# Patient Record
Sex: Male | Born: 1985 | Race: White | Hispanic: No | Marital: Single | Smoking: Never smoker
Health system: Southern US, Community
[De-identification: ages and names within clinical notes are randomized; demographics above are authoritative.]

## PROBLEM LIST (undated history)

## (undated) HISTORY — PX: OTHER SURGICAL HISTORY: SHX169

---
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
## 2010-12-29 ENCOUNTER — Emergency Department (HOSPITAL_COMMUNITY): Payer: Self-pay

## 2010-12-29 ENCOUNTER — Emergency Department (HOSPITAL_COMMUNITY)
Admission: EM | Admit: 2010-12-29 | Discharge: 2010-12-29 | Disposition: A | Discharge status: Home / Self Care | Payer: Self-pay | Attending: Emergency Medicine | Admitting: Emergency Medicine

## 2010-12-29 DIAGNOSIS — R1013 Epigastric pain: Secondary | ICD-10-CM | POA: Insufficient documentation

## 2010-12-29 DIAGNOSIS — R079 Chest pain, unspecified: Secondary | ICD-10-CM | POA: Insufficient documentation

## 2012-10-27 ENCOUNTER — Emergency Department (HOSPITAL_COMMUNITY)
Admission: EM | Admit: 2012-10-27 | Discharge: 2012-10-27 | Disposition: A | Discharge status: Home / Self Care | Payer: Self-pay | Attending: Emergency Medicine | Admitting: Emergency Medicine

## 2012-10-27 ENCOUNTER — Encounter (HOSPITAL_COMMUNITY): Payer: Self-pay | Admitting: Emergency Medicine

## 2012-10-27 ENCOUNTER — Emergency Department (HOSPITAL_COMMUNITY): Payer: Self-pay

## 2012-10-27 DIAGNOSIS — R61 Generalized hyperhidrosis: Secondary | ICD-10-CM | POA: Insufficient documentation

## 2012-10-27 DIAGNOSIS — R109 Unspecified abdominal pain: Secondary | ICD-10-CM | POA: Insufficient documentation

## 2012-10-27 DIAGNOSIS — R3915 Urgency of urination: Secondary | ICD-10-CM | POA: Insufficient documentation

## 2012-10-27 LAB — URINALYSIS, ROUTINE W REFLEX MICROSCOPIC
Bilirubin Urine: NEGATIVE
Glucose, UA: NEGATIVE mg/dL
Specific Gravity, Urine: 1.017 (ref 1.005–1.030)
Urobilinogen, UA: 1 mg/dL (ref 0.0–1.0)

## 2012-10-27 LAB — URINE MICROSCOPIC-ADD ON

## 2012-10-27 NOTE — ED Provider Notes (Signed)
History    CSN: [id number] Arrival date & time 10/27/12  8295  First MD Initiated Contact with Patient 10/27/12 [phone]     Chief Complaint  Patient presents with  . Flank Pain   (Consider location/radiation/quality/duration/timing/severity/associated sxs/prior Treatment) HPI Comments: 27 year old male with no significant past medical history presents to the emergency department complaining of sudden onset left-sided flank pain beginning this morning about 2 hours prior to arrival. Patient states he woke up around 7:30 this morning, half an hour later he went to use the restroom any difficulty urinating and developed severe left-sided flank pain, 8/10. Pain lasted about 2 hours and subsided about 5 minutes prior to arriving to the emergency department without any specific intervention. States the pain left him "writing on the floor, flushed and sweaty". Admits to urinary urgency. Denies nausea, vomiting, hematuria, dysuria. No history of kidney stones. Denies fever or chills.  Patient is a 27 y.o. male presenting with flank pain. The history is provided by the patient.  Flank Pain Associated symptoms include diaphoresis. Pertinent negatives include no abdominal pain, chills, fever, nausea or vomiting.   No past medical history on file. Past Surgical History  Procedure Laterality Date  . Left arm surgery     No family history on file. History  Substance Use Topics  . Smoking status: Not on file  . Smokeless tobacco: Not on file  . Alcohol Use: Not on file    Review of Systems  Constitutional: Positive for diaphoresis. Negative for fever and chills.  Gastrointestinal: Negative for nausea, vomiting and abdominal pain.  Genitourinary: Positive for urgency, flank pain and decreased urine volume. Negative for dysuria, hematuria, penile swelling, scrotal swelling, penile pain and testicular pain.  All other systems reviewed and are negative.    Allergies  Review of patient's allergies  indicates no known allergies.  Home Medications  No current outpatient prescriptions on file. BP 114/77  Pulse 68  Temp(Src) 98.1 F (36.7 C) (Oral)  Resp 16  SpO2 96% Physical Exam  Nursing note and vitals reviewed. Constitutional: He is oriented to person, place, and time. He appears well-developed and well-nourished. No distress.  HENT:  Head: Normocephalic and atraumatic.  Mouth/Throat: Oropharynx is clear and moist.  Eyes: Conjunctivae are normal.  Neck: Normal range of motion. Neck supple.  Cardiovascular: Normal rate, regular rhythm and normal heart sounds.   Pulmonary/Chest: Effort normal and breath sounds normal. No respiratory distress.  Abdominal: Soft. Normal appearance and bowel sounds are normal. He exhibits no distension and no mass. There is no tenderness. There is CVA tenderness (left). There is no rigidity, no rebound and no guarding.  Musculoskeletal: Normal range of motion. He exhibits no edema.  Neurological: He is alert and oriented to person, place, and time.  Skin: Skin is warm and dry. He is not diaphoretic.  Psychiatric: He has a normal mood and affect. His behavior is normal.    ED Course  Procedures (including critical care time) Labs Reviewed  URINALYSIS, ROUTINE W REFLEX MICROSCOPIC - Abnormal; Notable for the following:    Hgb urine dipstick LARGE (*)    All other components within normal limits  URINE MICROSCOPIC-ADD ON   Ct Abdomen Pelvis Wo Contrast  10/27/2012   *RADIOLOGY REPORT*  Clinical Data: Left flank pain for 1 day  CT ABDOMEN AND PELVIS WITHOUT CONTRAST  Technique:  Multidetector CT imaging of the abdomen and pelvis was performed following the standard protocol without intravenous contrast.  Comparison: None.  Findings: The lung  bases are clear.  The liver is unremarkable in the unenhanced state.  No calcified gallstones are seen.  The pancreas is normal in size and the pancreatic duct is not dilated. The adrenal glands and spleen are  unremarkable.  The stomach is not well distended.  No renal calculi are seen, and there is no evidence of hydronephrosis.  The proximal ureters are normal in caliber.  The abdominal aorta is normal in caliber.  No adenopathy is seen.  The distal ureters also are normal in caliber and no distal ureteral calculus is noted.  The urinary bladder is not well distended but no abnormality is seen.  The prostate is normal in size.  No abnormality of the colon is seen.  The terminal ileum is unremarkable, and the appendix fills with air normally.  No inflammatory process is seen.  No hernia is evident.  The lumbar vertebrae are in normal alignment with normal intervertebral disc spaces.  IMPRESSION:  1.  No explanation for the patient's left flank pain is seen.  No renal or ureteral calculi are noted. 2.  The appendix and terminal ileum are unremarkable.   Original Report Authenticated By: Dwyane Dee, M.D.   1. Flank pain     MDM  Patient with left flank pain, urgency. Probable kidney stone. Will obtain UA, CT abd/pelvis. Currently asymptomatic. 11:53 AM CT scan without evidence of stone. Asymptomatic in ED. U/A with large hgb. Probable passed stone. He is in NAD. Stable for discharge. Return precautions discussed. Patient states understanding of plan and is agreeable.   Trevor Mace, PA-C 10/27/12 1154

## 2012-10-27 NOTE — Progress Notes (Signed)
[facility] provided pt with a [facility] app and a list of primary care resources.  °

## 2012-10-27 NOTE — ED Provider Notes (Signed)
Medical screening examination/treatment/procedure(s) were performed by non-physician practitioner and as supervising physician I was immediately available for consultation/collaboration.  Ethelda Chick, MD 10/27/12 [phone]

## 2012-10-27 NOTE — ED Notes (Signed)
Per EMS patient reports left flank pain this am that left him writhing on floor, flushed, and sweating, subsided some approximately five minutes ago.

## 2014-04-25 ENCOUNTER — Encounter (HOSPITAL_COMMUNITY): Payer: Self-pay | Admitting: Emergency Medicine

## 2014-04-25 ENCOUNTER — Emergency Department (HOSPITAL_COMMUNITY)
Admission: EM | Admit: 2014-04-25 | Discharge: 2014-04-25 | Disposition: A | Discharge status: Home / Self Care | Payer: Self-pay | Attending: Emergency Medicine | Admitting: Emergency Medicine

## 2014-04-25 DIAGNOSIS — H1032 Unspecified acute conjunctivitis, left eye: Secondary | ICD-10-CM | POA: Insufficient documentation

## 2014-04-25 DIAGNOSIS — H109 Unspecified conjunctivitis: Secondary | ICD-10-CM

## 2014-04-25 MED ORDER — FLUORESCEIN SODIUM 1 MG OP STRP
1.0000 | ORAL_STRIP | Freq: Once | OPHTHALMIC | Status: AC
  Administered 2014-04-25: 1 via OPHTHALMIC

## 2014-04-25 MED ORDER — ERYTHROMYCIN 5 MG/GM OP OINT
TOPICAL_OINTMENT | Freq: Once | OPHTHALMIC | Status: AC
  Administered 2014-04-25: 1 via OPHTHALMIC
  Filled 2014-04-25: qty 3.5

## 2014-04-25 MED ORDER — TETRACAINE HCL 0.5 % OP SOLN
2.0000 [drp] | Freq: Once | OPHTHALMIC | Status: AC
  Administered 2014-04-25: 2 [drp] via OPHTHALMIC
  Filled 2014-04-25: qty 2

## 2014-04-25 NOTE — ED Notes (Signed)
1743 fluorecein strip and tetracaine at bedside.

## 2014-04-25 NOTE — ED Provider Notes (Signed)
CSN: [id number]     Arrival date & time 04/25/14  1707 History  This chart was scribed for non-physician practitioner working with Gwyneth SproutWhitney Plunkett, MD by Elveria Risingimelie Horne, ED Scribe. This patient was seen in room WTR7/WTR7 and the patient's care was started at 5:33 PM.   Chief Complaint  Patient presents with  . Eye Problem   The history is provided by the patient. No language interpreter was used.   HPI Comments: Reginald Snyder is a 29 y.o. male brought in by ambulance, who presents to the Emergency Department complaining of left eye pain and lid redness, onset yesterday afternoon while sitting on the porch. Patient denies presence of foreign body or direct injury. Patient denies drainage, or changes in vision, or photophobia. Patient does not wear contact lens. He has not tried any medications for this prior to arrival. He has been rubbing his eye because it is itchy. No history of eye problems.  History reviewed. No pertinent past medical history. Past Surgical History  Procedure Laterality Date  . Left arm surgery     History reviewed. No pertinent family history. History  Substance Use Topics  . Smoking status: Never Smoker   . Smokeless tobacco: Not on file  . Alcohol Use: No    Review of Systems  Constitutional: Negative for fever and chills.  HENT: Negative for congestion.   Eyes: Positive for pain, redness and itching. Negative for photophobia, discharge and visual disturbance.  Skin: Positive for color change.  Neurological: Negative for headaches.    Allergies  Review of patient's allergies indicates no known allergies.  Home Medications   Prior to Admission medications   Medication Sig Start Date End Date Taking? Authorizing Provider  ibuprofen (ADVIL,MOTRIN) 200 MG tablet Take 200 mg by mouth every 6 (six) hours as needed for mild pain.   Yes Historical Provider, MD  naproxen sodium (ANAPROX) 220 MG tablet Take 220 mg by mouth daily as needed (pain.).   Yes  Historical Provider, MD   Triage Vitals: BP 135/80 mmHg  Pulse 64  Temp(Src) 98.7 F (37.1 C) (Oral)  Resp 16  SpO2 100% Physical Exam  Constitutional: He is oriented to person, place, and time. He appears well-developed and well-nourished. No distress.  HENT:  Head: Normocephalic and atraumatic.  Eyes: EOM are normal. Left eye exhibits no discharge, no exudate and no hordeolum. No foreign body present in the left eye. Left conjunctiva is injected.  Slit lamp exam:      The left eye shows no corneal abrasion, no corneal ulcer, no foreign body, no hyphema and no hypopyon.  Left eye mildly injected. No drainage. Fluoroscopy seen stain is negative.  Neck: Neck supple. No tracheal deviation present.  Cardiovascular: Normal rate.   Pulmonary/Chest: Effort normal. No respiratory distress.  Musculoskeletal: Normal range of motion.  Neurological: He is alert and oriented to person, place, and time.  Skin: Skin is warm and dry.  Psychiatric: He has a normal mood and affect. His behavior is normal.  Nursing note and vitals reviewed.   ED Course  Procedures (including critical care time)  COORDINATION OF CARE: 5:33 PM- Discussed treatment plan with patient at bedside and patient agreed to plan.   Labs Review Labs Reviewed - No data to display  Imaging Review No results found.   EKG Interpretation None      MDM   Final diagnoses:  Conjunctivitis of left eye     patient with left eye pain and itching, gradual onset  yesterday while sitting outside. Patient is currently rubbing his eye. Eyes mildly injected, normal flow is seen stain.Visual Acuity - Bilateral Distance: 20/30 ; R Distance: 20/35 ; L Distance: 20/25.  No sensitivity to light. Most likely conjunctivitis. Will treat with erythromycin ointment, follow with ophthalmology if symptoms are worsening.   Filed Vitals:   04/25/14 1711  BP: 135/80  Pulse: 64  Temp: 98.7 F (37.1 C)  TempSrc: Oral  Resp: 16  SpO2:  100%     I personally performed the services described in this documentation, which was scribed in my presence. The recorded information has been reviewed and is accurate.    Lottie Mussel, PA-C 04/25/14 1830  Gwyneth Sprout, MD 04/26/14 2303

## 2014-04-25 NOTE — ED Notes (Signed)
Per EMS: Pt c/o lt eye pain and redness x 2 days.

## 2014-04-25 NOTE — Discharge Instructions (Signed)
Avoid rubbing her eye. Apply erythromycin ointment to the eyelid every 4 hours. Use for 7 days. Follow-up with an eye specialist if not improving in 2 days. Return if worsening and unable to follow up.   Conjunctivitis Conjunctivitis is commonly called "pink eye." Conjunctivitis can be caused by bacterial or viral infection, allergies, or injuries. There is usually redness of the lining of the eye, itching, discomfort, and sometimes discharge. There may be deposits of matter along the eyelids. A viral infection usually causes a watery discharge, while a bacterial infection causes a yellowish, thick discharge. Pink eye is very contagious and spreads by direct contact. You may be given antibiotic eyedrops as part of your treatment. Before using your eye medicine, remove all drainage from the eye by washing gently with warm water and cotton balls. Continue to use the medication until you have awakened 2 mornings in a row without discharge from the eye. Do not rub your eye. This increases the irritation and helps spread infection. Use separate towels from other household members. Wash your hands with soap and water before and after touching your eyes. Use cold compresses to reduce pain and sunglasses to relieve irritation from light. Do not wear contact lenses or wear eye makeup until the infection is gone. SEEK MEDICAL CARE IF:   Your symptoms are not better after 3 days of treatment.  You have increased pain or trouble seeing.  The outer eyelids become very red or swollen. Document Released: 04/30/2004 Document Revised: 06/15/2011 Document Reviewed: 03/23/2005 [facility] Patient Information 2015 [place], [place]. This information is not intended to replace advice given to you by your health care provider. Make sure you discuss any questions you have with your health care provider.

## 2014-07-05 IMAGING — CT CT ABD-PELV W/O CM
1 series · 15 of 26 positions shown, 19 images · non-contrast
Comparison: None.

CLINICAL DATA: Left flank pain for 1 day

CT ABDOMEN AND PELVIS WITHOUT CONTRAST
TECHNIQUE: Multidetector CT imaging of the abdomen and pelvis was
performed following the standard protocol without intravenous
contrast.

[Series 4: lung · axial · 0.67mm/px · z∈[-140,-25]mm · 15 of 26 slices shown, 19 images]
[im 2/26  soft-tissue]
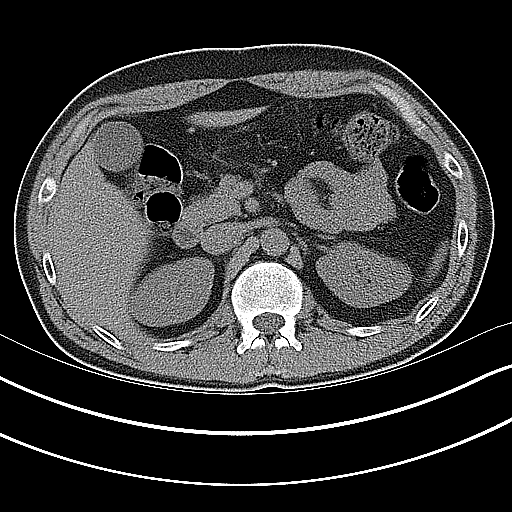
[im 2/26  bone]
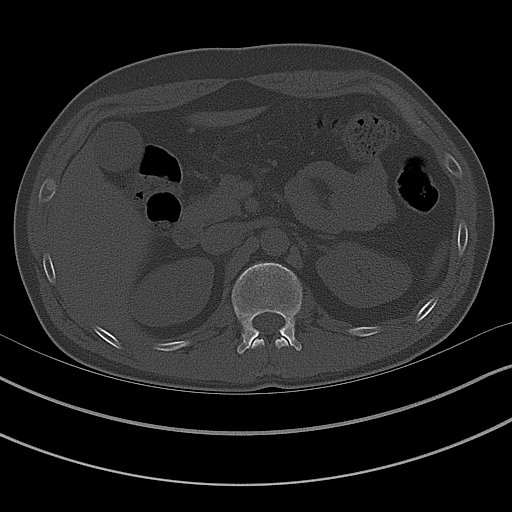
[im 4/26  soft-tissue]
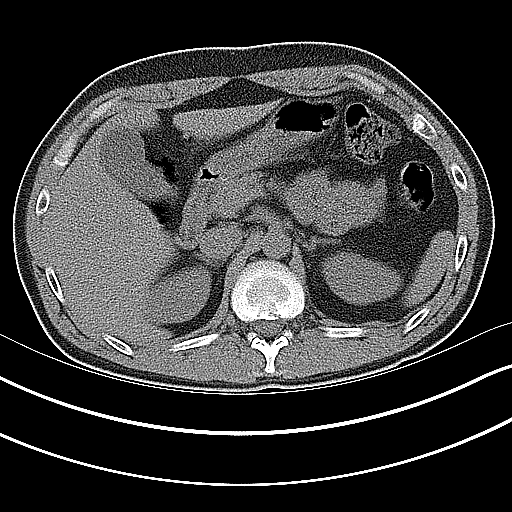
[im 6/26  soft-tissue]
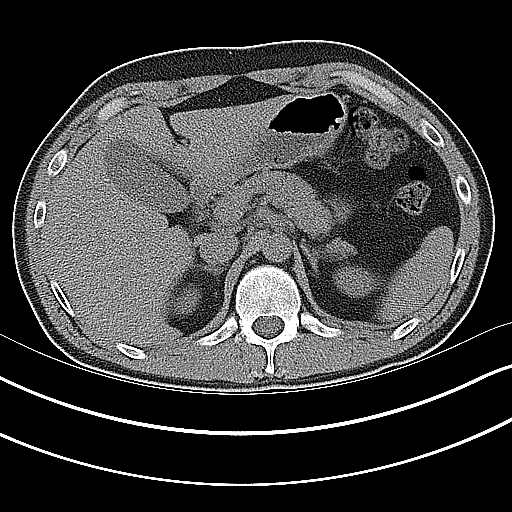
[im 8/26  soft-tissue]
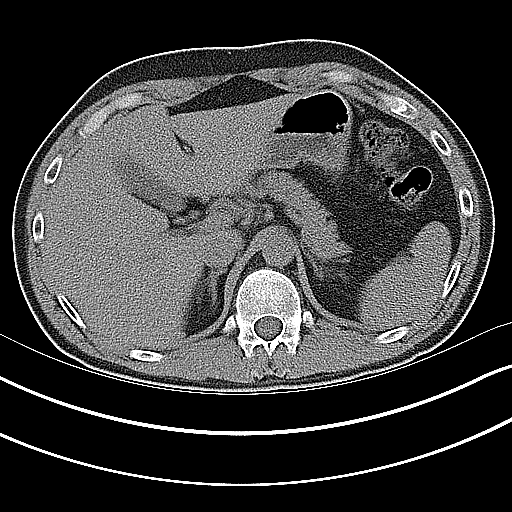
[im 10/26  soft-tissue]
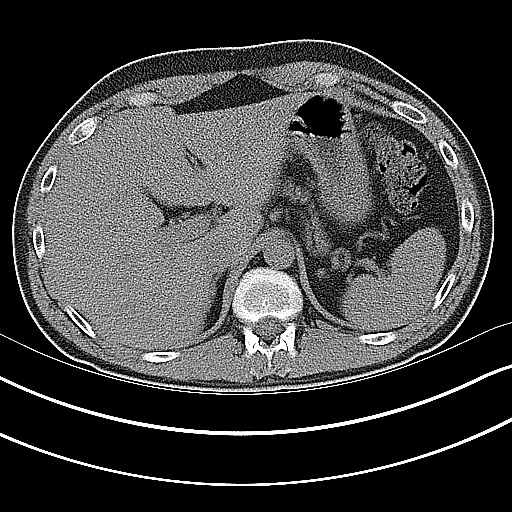
[im 12/26  soft-tissue]
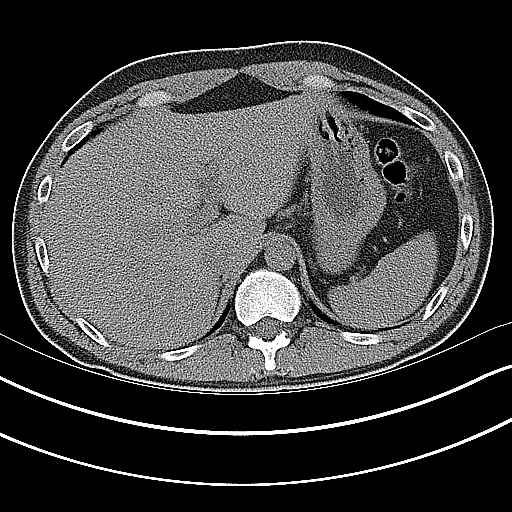
[im 14/26  soft-tissue]
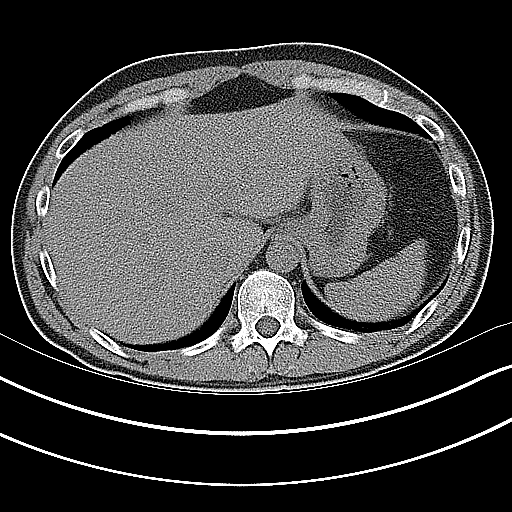
[im 15/26  soft-tissue]
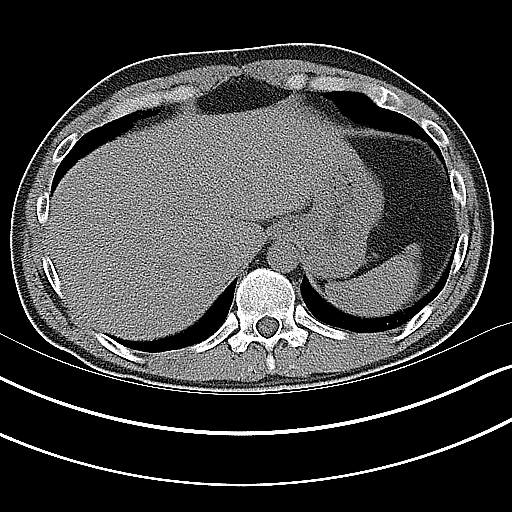
[im 17/26  soft-tissue]
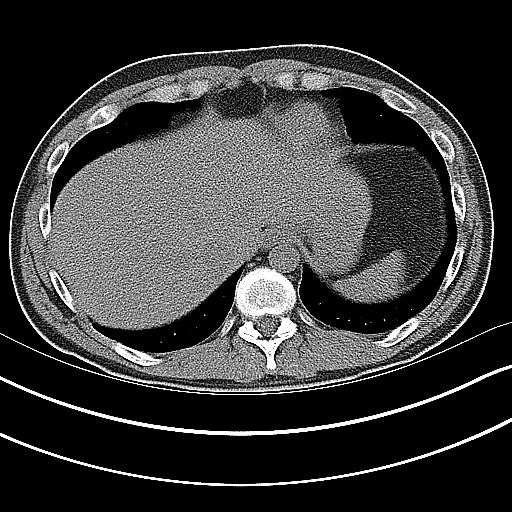
[im 17/26  bone]
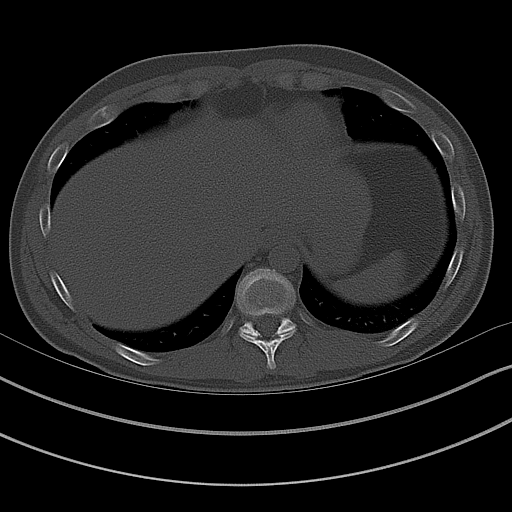
[im 19/26  soft-tissue]
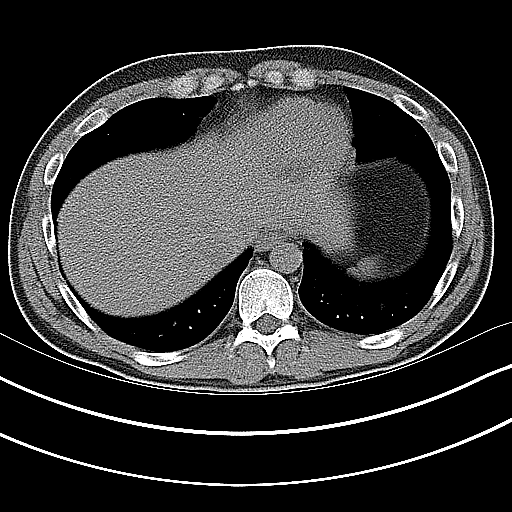
[im 21/26  soft-tissue]
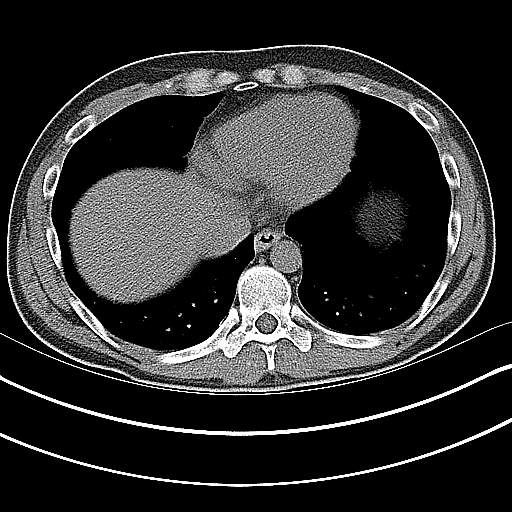
[im 22/26  lung]
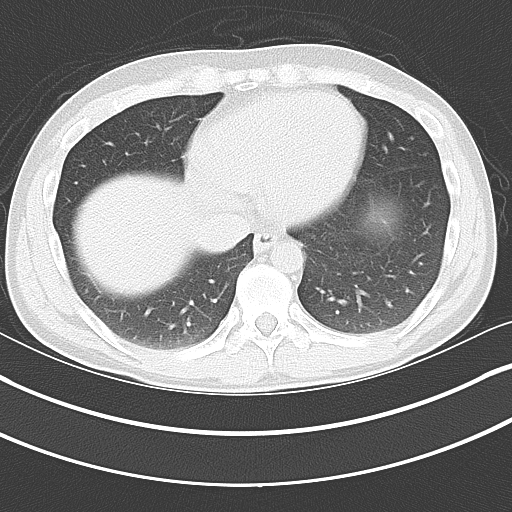
[im 23/26  soft-tissue]
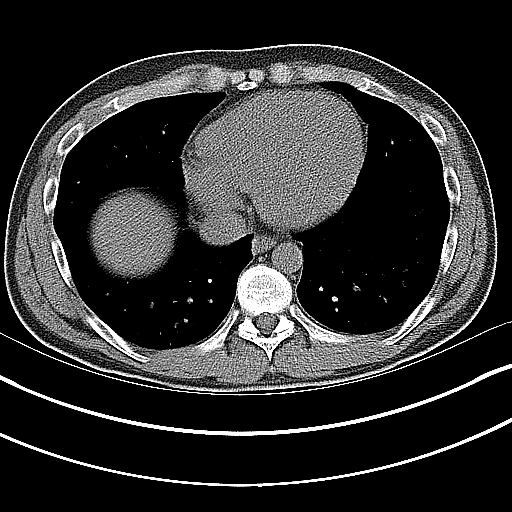
[im 23/26  lung]
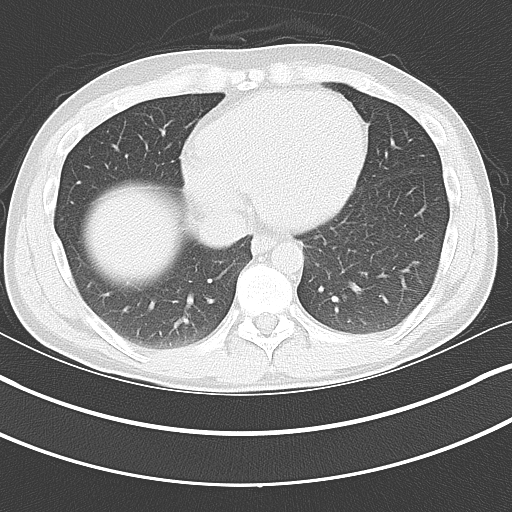
[im 24/26  lung]
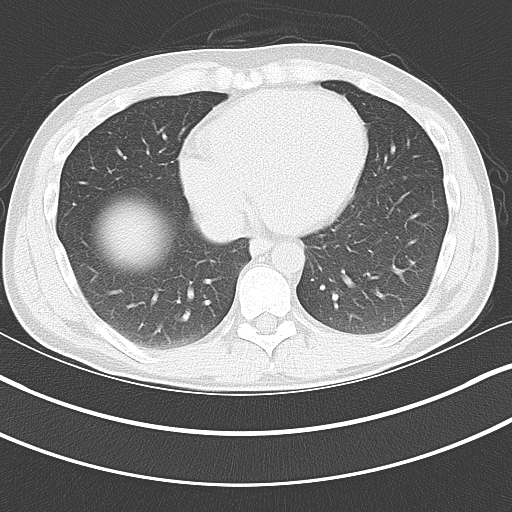
[im 25/26  soft-tissue]
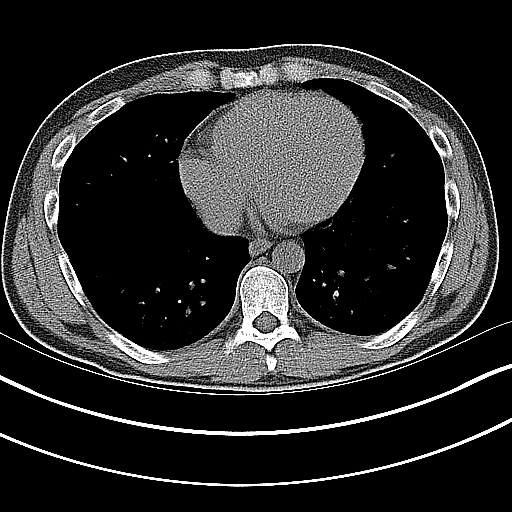
[im 25/26  lung]
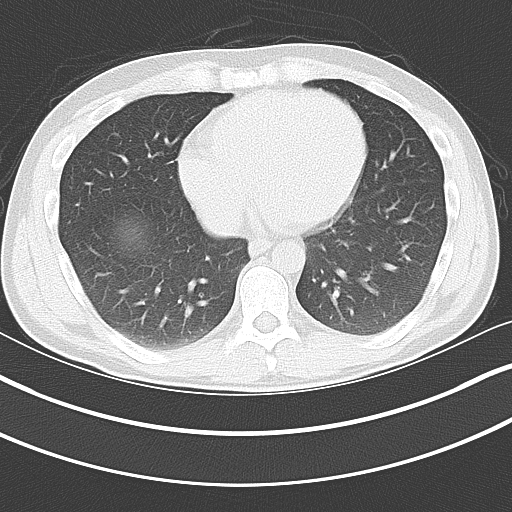

[15 of 26 positions shown; findings below may reference images not displayed]

FINDINGS: The lung bases are clear.  The liver is unremarkable in
the unenhanced state.  No calcified gallstones are seen.  The
pancreas is normal in size and the pancreatic duct is not dilated.
The adrenal glands and spleen are unremarkable.  The stomach is not
well distended.  No renal calculi are seen, and there is no
evidence of hydronephrosis.  The proximal ureters are normal in
caliber.  The abdominal aorta is normal in caliber.  No adenopathy
is seen.

The distal ureters also are normal in caliber and no distal
ureteral calculus is noted.  The urinary bladder is not well
distended but no abnormality is seen.  The prostate is normal in
size.  No abnormality of the colon is seen.  The terminal ileum is
unremarkable, and the appendix fills with air normally.  No
inflammatory process is seen.  No hernia is evident.  The lumbar
vertebrae are in normal alignment with normal intervertebral disc
spaces.
IMPRESSION: 1.  No explanation for the patient's left flank pain is seen.  No
renal or ureteral calculi are noted.
2.  The appendix and terminal ileum are unremarkable.

## 2016-12-04 ENCOUNTER — Ambulatory Visit: Payer: Self-pay | Admitting: Podiatry
